# Patient Record
Sex: Male | Born: 1990 | Race: Black or African American | Hispanic: No | Marital: Single | Smoking: Current every day smoker
Health system: Southern US, Community
[De-identification: ages and names within clinical notes are randomized; demographics above are authoritative.]

---

## 2014-11-11 ENCOUNTER — Emergency Department (HOSPITAL_COMMUNITY): Payer: Self-pay

## 2014-11-11 ENCOUNTER — Encounter (HOSPITAL_COMMUNITY): Payer: Self-pay | Admitting: *Deleted

## 2014-11-11 ENCOUNTER — Emergency Department (HOSPITAL_COMMUNITY)
Admission: EM | Admit: 2014-11-11 | Discharge: 2014-11-11 | Disposition: A | Discharge status: Home / Self Care | Payer: Self-pay | Attending: Emergency Medicine | Admitting: Emergency Medicine

## 2014-11-11 DIAGNOSIS — Z720 Tobacco use: Secondary | ICD-10-CM | POA: Insufficient documentation

## 2014-11-11 DIAGNOSIS — R079 Chest pain, unspecified: Secondary | ICD-10-CM | POA: Insufficient documentation

## 2014-11-11 DIAGNOSIS — R062 Wheezing: Secondary | ICD-10-CM | POA: Insufficient documentation

## 2014-11-11 DIAGNOSIS — R61 Generalized hyperhidrosis: Secondary | ICD-10-CM | POA: Insufficient documentation

## 2014-11-11 LAB — CBC
HEMATOCRIT: 46.2 % (ref 39.0–52.0)
Hemoglobin: 16.6 g/dL (ref 13.0–17.0)
MCH: 32.2 pg (ref 26.0–34.0)
MCHC: 35.9 g/dL (ref 30.0–36.0)
MCV: 89.5 fL (ref 78.0–100.0)
Platelets: 151 10*3/uL (ref 150–400)
RBC: 5.16 MIL/uL (ref 4.22–5.81)
RDW: 11.8 % (ref 11.5–15.5)
WBC: 7.7 10*3/uL (ref 4.0–10.5)

## 2014-11-11 LAB — BASIC METABOLIC PANEL
Anion gap: 16 — ABNORMAL HIGH (ref 5–15)
BUN: 21 mg/dL (ref 6–23)
CO2: 22 mmol/L (ref 19–32)
CREATININE: 1.49 mg/dL — AB (ref 0.50–1.35)
Calcium: 9.6 mg/dL (ref 8.4–10.5)
Chloride: 99 mmol/L (ref 96–112)
GFR calc Af Amer: 74 mL/min — ABNORMAL LOW (ref >90)
GFR calc non Af Amer: 64 mL/min — ABNORMAL LOW (ref >90)
GLUCOSE: 95 mg/dL (ref 70–99)
POTASSIUM: 2.8 mmol/L — AB (ref 3.5–5.1)
Sodium: 137 mmol/L (ref 135–145)

## 2014-11-11 LAB — I-STAT TROPONIN, ED: TROPONIN I, POC: 0 ng/mL (ref 0.00–0.08)

## 2014-11-11 LAB — BRAIN NATRIURETIC PEPTIDE: B Natriuretic Peptide: 8.3 pg/mL (ref 0.0–100.0)

## 2014-11-11 MED ORDER — POTASSIUM CHLORIDE CRYS ER 20 MEQ PO TBCR
60.0000 meq | EXTENDED_RELEASE_TABLET | Freq: Once | ORAL | Status: AC
  Administered 2014-11-11: 60 meq via ORAL
  Filled 2014-11-11: qty 3

## 2014-11-11 MED ORDER — SODIUM CHLORIDE 0.9 % IV BOLUS (SEPSIS)
1000.0000 mL | Freq: Once | INTRAVENOUS | Status: AC
  Administered 2014-11-11: 1000 mL via INTRAVENOUS

## 2014-11-11 MED ORDER — MAGNESIUM SULFATE 2 GM/50ML IV SOLN
2.0000 g | Freq: Once | INTRAVENOUS | Status: AC
  Administered 2014-11-11: 2 g via INTRAVENOUS
  Filled 2014-11-11: qty 50

## 2014-11-11 MED ORDER — MORPHINE SULFATE 4 MG/ML IJ SOLN
6.0000 mg | Freq: Once | INTRAMUSCULAR | Status: AC
  Administered 2014-11-11: 6 mg via INTRAVENOUS
  Filled 2014-11-11: qty 2

## 2014-11-11 MED ORDER — POTASSIUM CHLORIDE 10 MEQ/100ML IV SOLN
10.0000 meq | Freq: Once | INTRAVENOUS | Status: AC
  Administered 2014-11-11: 10 meq via INTRAVENOUS
  Filled 2014-11-11: qty 100

## 2014-11-11 NOTE — ED Notes (Signed)
Patient transported to X-ray 

## 2014-11-11 NOTE — Discharge Instructions (Signed)
Chest Pain (Nonspecific) Mr. Lawrence Nguyen, take Motrin or Tylenol as needed for pain. See a primary care physician within 3 days for your chest pain. If symptoms worsen come back to the emergency department immediately. Thank you. It is often hard to give a diagnosis for the cause of chest pain. There is always a chance that your pain could be related to something serious, such as a heart attack or a blood clot in the lungs. You need to follow up with your doctor. HOME CARE  If antibiotic medicine was given, take it as directed by your doctor. Finish the medicine even if you start to feel better.  For the next few days, avoid activities that bring on chest pain. Continue physical activities as told by your doctor.  Do not use any tobacco products. This includes cigarettes, chewing tobacco, and e-cigarettes.  Avoid drinking alcohol.  Only take medicine as told by your doctor.  Follow your doctor's suggestions for more testing if your chest pain does not go away.  Keep all doctor visits you made. GET HELP IF:  Your chest pain does not go away, even after treatment.  You have a rash with blisters on your chest.  You have a fever. GET HELP RIGHT AWAY IF:   You have more pain or pain that spreads to your arm, neck, jaw, back, or belly (abdomen).  You have shortness of breath.  You cough more than usual or cough up blood.  You have very bad back or belly pain.  You feel sick to your stomach (nauseous) or throw up (vomit).  You have very bad weakness.  You pass out (faint).  You have chills. This is an emergency. Do not wait to see if the problems will go away. Call your local emergency services (911 in [place] Do not drive yourself to the hospital. MAKE SURE YOU:   Understand these instructions.  Will watch your condition.  Will get help right away if you are not doing well or get worse. Document Released: 12/22/2007 Document Revised: 07/10/2013 Document Reviewed:  12/22/2007 [facility] Patient Information 2015 [place], [place]. This information is not intended to replace advice given to you by your health care provider. Make sure you discuss any questions you have with your health care provider.

## 2014-11-11 NOTE — ED Notes (Signed)
Patients significant other to pick pt up from lobby.  Patient instructed not to drive

## 2014-11-11 NOTE — ED Notes (Signed)
Pt c/o chest pain x 1 month. Also c/o back pain that is sharp. Pt is diaphoretic and shivering.

## 2014-11-11 NOTE — ED Provider Notes (Addendum)
CSN: [id number]     Arrival date & time 11/11/14  0241 History  This chart was scribed for Lawrence CrumbleAdeleke Jebadiah Imperato, MD by Tanda RockersMargaux Venter, ED Scribe. This patient was seen in room D31C/D31C and the patient's care was started at 3:00 AM.    Chief Complaint  Patient presents with  . Chest Pain   The history is provided by the patient. No language interpreter was used.     HPI Comments: [name] is a 24 y.o. male who presents to the Emergency Department complaining of mid chest pain that began 1 month ago. He describes the chest pain as a tightness. Pt reports sharp back pain, diaphoresis, and chills that began approximately 2 days ago. He states that his back pain is exacerbated while sitting up for extended periods of time. He states that he has been short of breath as well. Pt mentions playing basketball 3 weeks ago but denies any recent strenuous activity. He denies fever, nasal congestion, cough, rhinorrhea, numbness or weakness in extremities.    History reviewed. No pertinent past medical history. History reviewed. No pertinent past surgical history. No family history on file. History  Substance Use Topics  . Smoking status: Current Every Day Smoker -- 0.50 packs/day    Types: Cigarettes  . Smokeless tobacco: Not on file  . Alcohol Use: Yes     Comment: none in 3 weeks    Review of Systems  10 Systems reviewed and all are negative for acute change except as noted in the HPI.    Allergies  Review of patient's allergies indicates no known allergies.  Home Medications   Prior to Admission medications   Not on File   Triage Vitals: BP 117/76 mmHg  Pulse 71  Resp 16  SpO2 98%   Physical Exam  Constitutional: He is oriented to person, place, and time. Vital signs are normal. He appears well-developed and well-nourished.  Non-toxic appearance. He does not appear ill. No distress.  HENT:  Head: Normocephalic and atraumatic.  Nose: Nose normal.  Mouth/Throat: Oropharynx is clear  and moist. No oropharyngeal exudate.  Eyes: Conjunctivae and EOM are normal. Pupils are equal, round, and reactive to light. No scleral icterus.  Neck: Normal range of motion. Neck supple. No tracheal deviation, no edema, no erythema and normal range of motion present. No thyroid mass and no thyromegaly present.  Cardiovascular: Normal rate, regular rhythm, S1 normal, S2 normal, normal heart sounds, intact distal pulses and normal pulses.  Exam reveals no gallop and no friction rub.   No murmur heard. Pulses:      Radial pulses are 2+ on the right side, and 2+ on the left side.       Dorsalis pedis pulses are 2+ on the right side, and 2+ on the left side.  Pulmonary/Chest: Effort normal. No respiratory distress. He has wheezes (Expiratory wheezing on right side only. ). He has no rhonchi. He has no rales.  Abdominal: Soft. Normal appearance and bowel sounds are normal. He exhibits no distension, no ascites and no mass. There is no hepatosplenomegaly. There is no tenderness. There is no rebound, no guarding and no CVA tenderness.  Musculoskeletal: Normal range of motion. He exhibits no edema or tenderness.  Lymphadenopathy:    He has no cervical adenopathy.  Neurological: He is alert and oriented to person, place, and time. He has normal strength. No cranial nerve deficit or sensory deficit.  Skin: Skin is warm and intact. No petechiae and no rash noted. He  is diaphoretic. No erythema. No pallor.  Psychiatric: He has a normal mood and affect. His behavior is normal. Judgment normal.  Nursing note and vitals reviewed.   ED Course  Procedures (including critical care time)  DIAGNOSTIC STUDIES: Oxygen Saturation is 98% on RA, normal by my interpretation.    COORDINATION OF CARE: 3:05 AM-Discussed treatment plan which includes CXR with pt at bedside and pt agreed to plan.   Labs Review Labs Reviewed  BASIC METABOLIC PANEL - Abnormal; Notable for the following:    Potassium 2.8 (*)     Creatinine, Ser 1.49 (*)    GFR calc non Af Amer 64 (*)    GFR calc Af Amer 74 (*)    Anion gap 16 (*)    All other components within normal limits  CBC  BRAIN NATRIURETIC PEPTIDE  I-STAT TROPOININ, ED    Imaging Review Dg Chest 2 View  11/11/2014   CLINICAL DATA:  Chronic mid chest pain and tightness. Sharp back pain and diaphoresis. Chills. Initial encounter.  EXAM: CHEST  2 VIEW  COMPARISON:  None.  FINDINGS: The lungs are well-aerated and clear. There is no evidence of focal opacification, pleural effusion or pneumothorax.  The heart is normal in size; the mediastinal contour is within normal limits. No acute osseous abnormalities are seen.  IMPRESSION: No acute cardiopulmonary process seen.   Electronically Signed   By: Roanna Raider M.D.   On: 11/11/2014 04:31     EKG Interpretation   Date/Time:  Monday November 11 2014 02:51:08 EDT Ventricular Rate:  96 PR Interval:  142 QRS Duration: 82 QT Interval:  332 QTC Calculation: 419 R Axis:   78 Text Interpretation:  Normal sinus rhythm wandering baseline Confirmed by  Erroll Luna [phone]) on 11/11/2014 4:28:32 AM      MDM   Final diagnoses:  Chest pain   Patient presents to the emergency department for chest pain for one month. He states over the last couple weeks he developed back pain that is sharp as well. It is made worse when he is sitting up for a prolonged period of time. He denies any upper or neurological symptoms suggesting an acute aortic dissection. I have low concern for this diagnosis. He has no pulse deficit, chest x-ray shows no widened mediastinum or other suggestions of aortic dissection. He's done no heavy lifting or strenuous activities. He denies history of hypertension. Patient does admit to smoking since he was 24 years old. This certainly could be the cause of some chest discomfort. He was counseled on this. Patient was given IV fluids, potassium/magnesium replacement, and morphine. Upon repeat  evaluation patient has good control of his pain.  He was advised to continue Motrin or Tylenol at home for pain. Primary care follow-up was provided for him. His vital signs were within his normal limits and he is safe for discharge.  I personally performed the services described in this documentation, which was scribed in my presence. The recorded information has been reviewed and is accurate.     Lawrence Crumble, MD 11/11/14 [id number]  Lawrence Crumble, MD 11/11/14 [phone]

## 2016-04-23 IMAGING — CR DG CHEST 2V
2 series · 2 of 2 positions shown · non-contrast
Comparison: None.

CLINICAL DATA: Chronic mid chest pain and tightness. Sharp back
pain and diaphoresis. Chills. Initial encounter.

EXAM:
CHEST  2 VIEW

[chest pa]
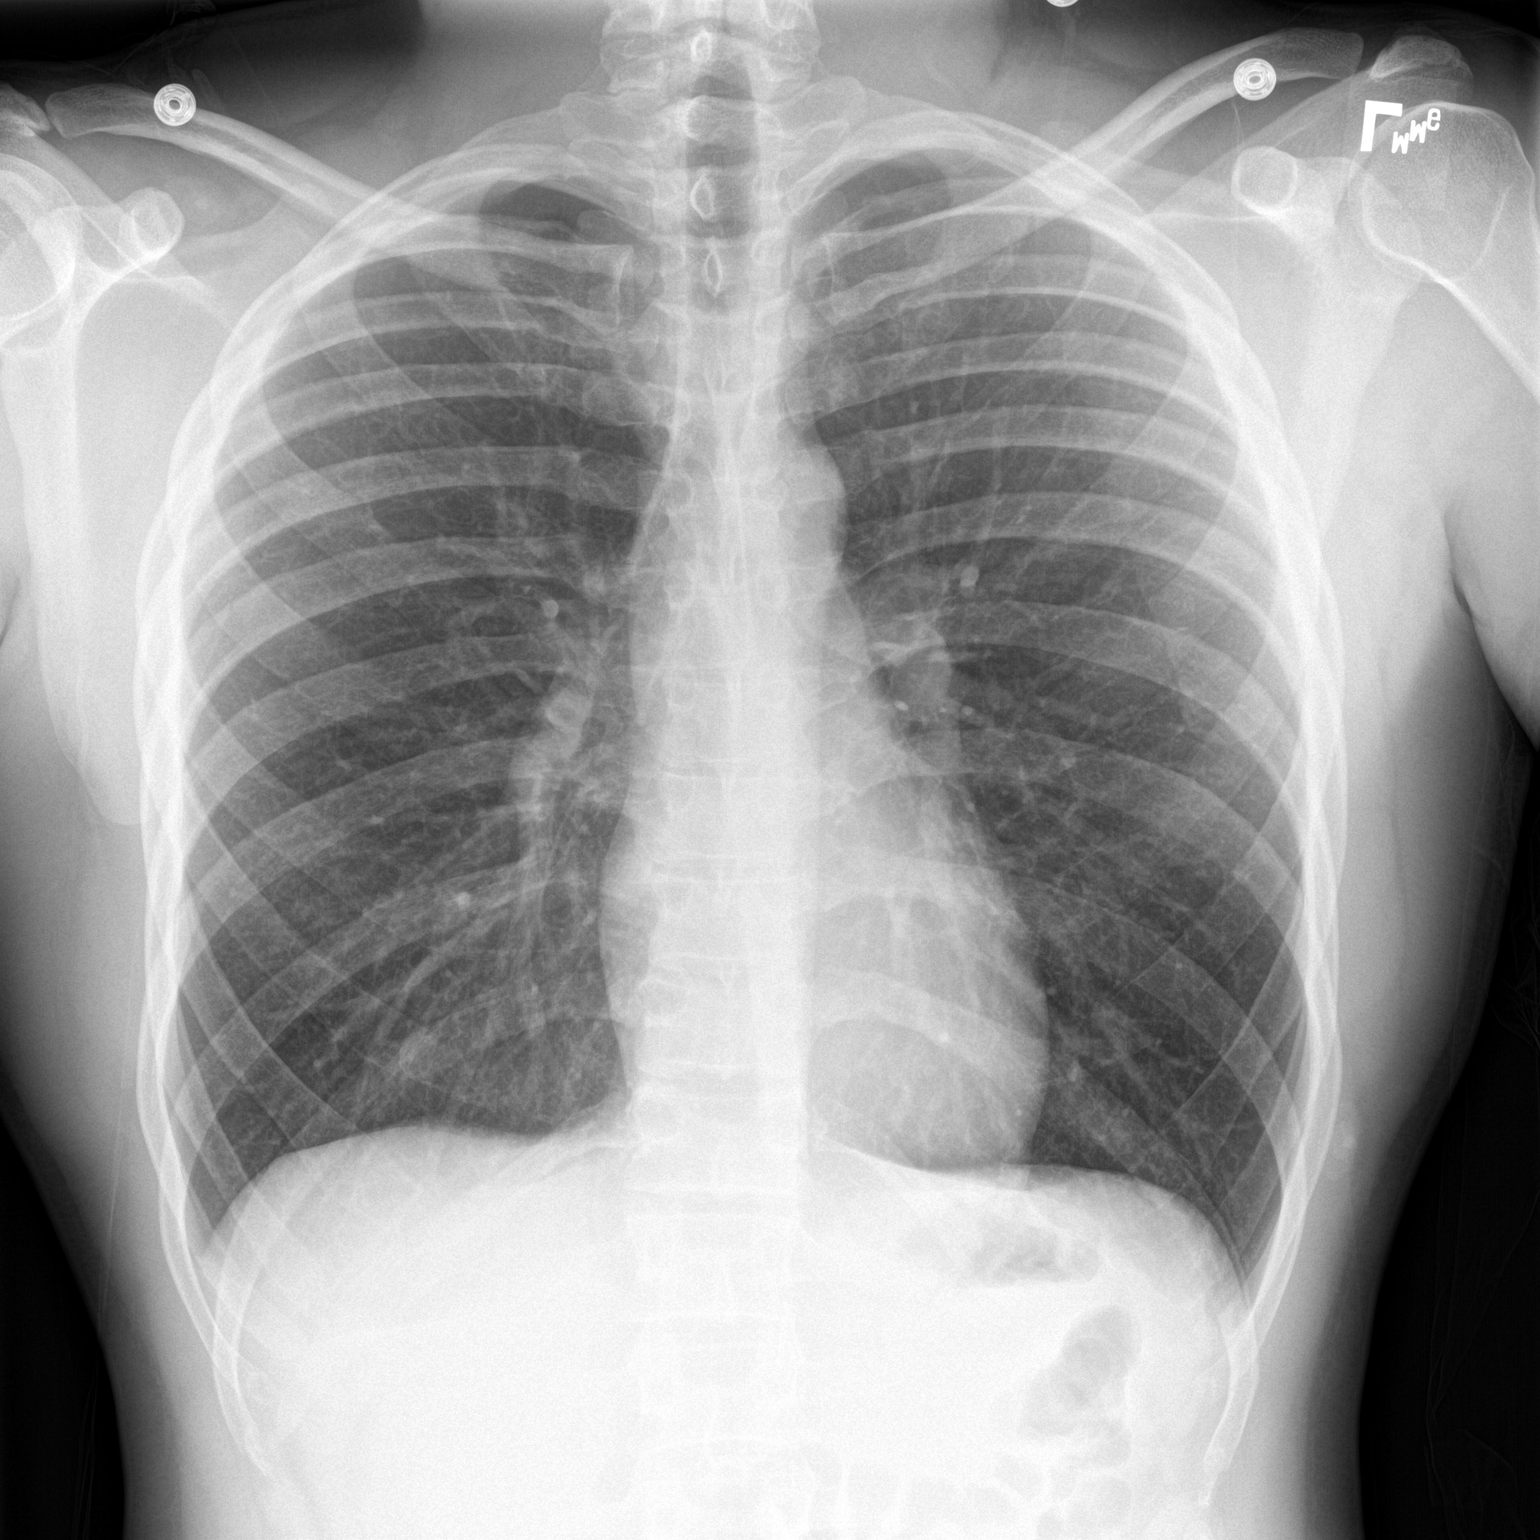

[chest lat]
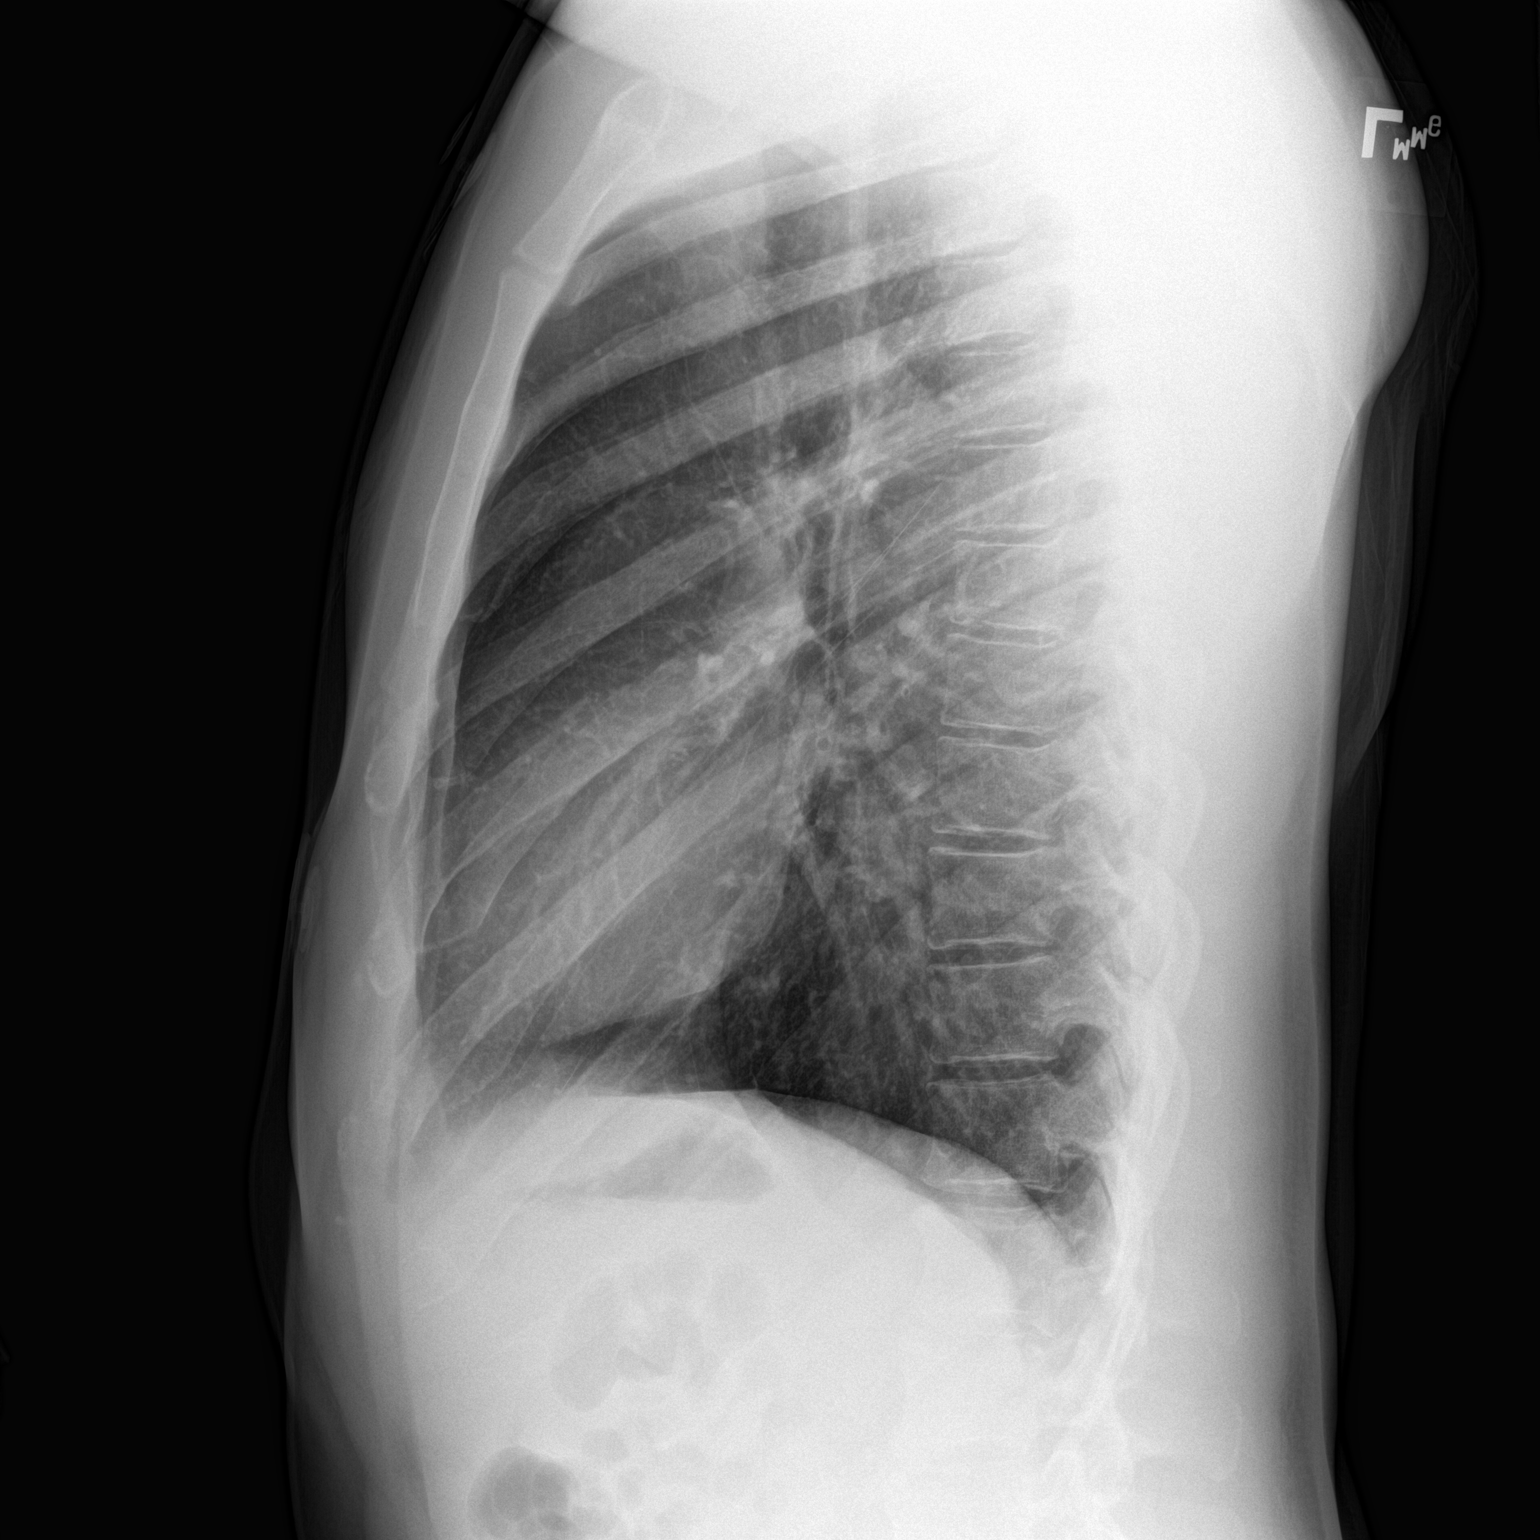

[2 of 2 positions shown; findings below may reference images not displayed]

FINDINGS: The lungs are well-aerated and clear. There is no evidence of focal
opacification, pleural effusion or pneumothorax.

The heart is normal in size; the mediastinal contour is within
normal limits. No acute osseous abnormalities are seen.
IMPRESSION: No acute cardiopulmonary process seen.
